# Patient Record
Sex: Male | Born: 1985 | Race: White | Hispanic: No | Marital: Single | Smoking: Current every day smoker
Health system: Southern US, Community
[De-identification: ages and names within clinical notes are randomized; demographics above are authoritative.]

## PROBLEM LIST (undated history)

## (undated) DIAGNOSIS — F419 Anxiety disorder, unspecified: Secondary | ICD-10-CM

## (undated) HISTORY — DX: Anxiety disorder, unspecified: F41.9

---

## 2009-08-30 ENCOUNTER — Ambulatory Visit: Payer: Self-pay | Admitting: Occupational Medicine

## 2009-09-14 ENCOUNTER — Ambulatory Visit: Payer: Self-pay | Admitting: Family Medicine

## 2010-08-02 ENCOUNTER — Ambulatory Visit
Admission: RE | Admit: 2010-08-02 | Discharge: 2010-08-02 | Payer: Self-pay | Source: Home / Self Care | Admitting: Emergency Medicine

## 2010-08-02 ENCOUNTER — Encounter: Payer: Self-pay | Admitting: Emergency Medicine

## 2010-08-08 ENCOUNTER — Telehealth (INDEPENDENT_AMBULATORY_CARE_PROVIDER_SITE_OTHER): Payer: Self-pay | Admitting: *Deleted

## 2010-08-23 NOTE — Letter (Signed)
Summary: Out of Work  [facility]  [address]   [place], [place] [address]   Phone: [phone]  Fax: [phone]    September 14, 2009   Employee:  Edward Anderson    To Whom It May Concern:   For Medical reasons, please excuse the above named employee from work today and tomorrow.    If you need additional information, please feel free to contact our office.         Sincerely,    Donna Christen MD

## 2010-08-23 NOTE — Assessment & Plan Note (Signed)
Summary: SORE THROAT & EARACHE/KH   Vital Signs:  Patient Profile:   25 Years Old Male CC:      Sore throat, exhaustion x 2 days Height:     66 inches Weight:      154 pounds O2 Sat:      98 % O2 treatment:    Room Air Temp:     98.1 degrees F oral Pulse rate:   69 / minute Pulse rhythm:   regular Resp:     12 per minute BP sitting:   127 / 75  (right arm) Cuff size:   regular  Vitals Entered By: Avel Sensor, CMA                  Current Allergies (reviewed today): No known allergies History of Present Illness Chief Complaint: Sore throat, exhaustion x 2 days History of Present Illness: Subjective: Patient complains of sore throat for 2 days.  He states that his previous flu-like illness resolved.  Sore throat is worse at night + mild cough No pleuritic pain No wheezing + mild nasal congestion No post-nasal drainage No sinus pain/pressure No itchy/red eyes No earache No hemoptysis No SOB + fever/chills No nausea No vomiting No abdominal pain No diarrhea No skin rashes + fatigue + myalgias No headache    Current Meds DAYQUIL MULTI-SYMPTOM 30-325-10 MG/15ML LIQD (PSEUDOEPHEDRINE-APAP-DM) as needed since last night IBUPROFEN 600 MG TABS (IBUPROFEN) prn NYQUIL 60-7.12-20-998 MG/30ML LIQD (PSEUDOEPH-DOXYLAMINE-DM-APAP)  AMOXICILLIN 500 MG CAPS (AMOXICILLIN) One capsule by mouth three times a day LORTAB 5 5-500 MG TABS (HYDROCODONE-ACETAMINOPHEN) One or two tabs by mouth hs as needed pain BENZONATATE 200 MG CAPS (BENZONATATE) One by mouth hs as needed cough  REVIEW OF SYSTEMS Constitutional Symptoms       Complains of fever and fatigue.     Denies chills, night sweats, weight loss, and weight gain.      Comments: At night Eyes       Complains of eye pain.      Denies change in vision, eye discharge, glasses, contact lenses, and eye surgery. Ear/Nose/Throat/Mouth       Complains of ear pain and sore throat.      Denies hearing loss/aids, change in hearing,  ear discharge, dizziness, frequent runny nose, frequent nose bleeds, sinus problems, hoarseness, and tooth pain or bleeding.  Respiratory       Denies dry cough, productive cough, wheezing, shortness of breath, asthma, bronchitis, and emphysema/COPD.  Cardiovascular       Denies murmurs, chest pain, and tires easily with exhertion.    Gastrointestinal       Denies stomach pain, nausea/vomiting, diarrhea, constipation, blood in bowel movements, and indigestion. Genitourniary       Denies painful urination, kidney stones, and loss of urinary control. Neurological       Denies paralysis, seizures, and fainting/blackouts. Musculoskeletal       Denies muscle pain, joint pain, joint stiffness, decreased range of motion, redness, swelling, muscle weakness, and gout.  Skin       Denies bruising, unusual mles/lumps or sores, and hair/skin or nail changes.  Psych       Denies mood changes, temper/anger issues, anxiety/stress, speech problems, depression, and sleep problems.  Past History:  Past Medical History: Reviewed history from 08/30/2009 and no changes required. Unremarkable  Past Surgical History: Reviewed history from 08/30/2009 and no changes required. Denies surgical history  Family History: Reviewed history from 08/30/2009 and no changes required. Brother-schizophrenia DM-grandmother  Social  History: Reviewed history from 08/30/2009 and no changes required. Occupation:landscaping Married Current Smoker- 1ppd Alcohol use-no Drug use-no Regular exercise-yes   Objective:  No acute distress  Eyes:  Pupils are equal, round, and reactive to light and accomdation.  Extraocular movement is intact.  Conjunctivae are not inflamed.  Ears:  Canals normal.  Tympanic membranes normal.   Nose:  Normal septum.  Normal turbinates, mildly congested.  No sinus tenderness present.  Pharynx:  Mildly erythematous Neck:  Supple.  No adenopathy is present.  Lungs:  Clear to auscultation.   Breath sounds are equal.  Heart:  Regular rate and rhythm without murmurs, rubs, or gallops.  Abdomen:  Nontender without masses or hepatosplenomegaly.  Bowel sounds are present.  No CVA or flank tenderness.  Rapid strep test negative  Assessment New Problems: ACUTE PHARYNGITIS (ICD-462)  RECURRENT VIRAL URI  Plan New Medications/Changes: BENZONATATE 200 MG CAPS (BENZONATATE) One by mouth hs as needed cough  #12 x 0, 09/14/2009, Donna Christen MD LORTAB 5 5-500 MG TABS (HYDROCODONE-ACETAMINOPHEN) One or two tabs by mouth hs as needed pain  #10 (ten) x 0, 09/14/2009, Donna Christen MD AMOXICILLIN 500 MG CAPS (AMOXICILLIN) One capsule by mouth three times a day  #21 x 0, 09/14/2009, Donna Christen MD  New Orders: Est. Patient Level III [16109] Rapid Strep [60454] Planning Comments:   With a second viral URI, patient more likely to experience a secondary bacterial infection.  Will begin Amoxicillin.  Begin expectorant/ decongestant.  Analgesic for sore throat at night.  Cough suppressant if necessary at night. Follow-up with PCP if not improving.   The patient and/or caregiver has been counseled thoroughly with regard to medications prescribed including dosage, schedule, interactions, rationale for use, and possible side effects and they verbalize understanding.  Diagnoses and expected course of recovery discussed and will return if not improved as expected or if the condition worsens. Patient and/or caregiver verbalized understanding.  Prescriptions: BENZONATATE 200 MG CAPS (BENZONATATE) One by mouth hs as needed cough  #12 x 0   Entered and Authorized by:   Donna Christen MD   Signed by:   Donna Christen MD on 09/14/2009   Method used:   Print then Give to Patient   RxID:   [id number] LORTAB 5 5-500 MG TABS (HYDROCODONE-ACETAMINOPHEN) One or two tabs by mouth hs as needed pain  #10 (ten) x 0   Entered and Authorized by:   Donna Christen MD   Signed by:   Donna Christen MD on  09/14/2009   Method used:   Print then Give to Patient   RxID:   [id number] AMOXICILLIN 500 MG CAPS (AMOXICILLIN) One capsule by mouth three times a day  #21 x 0   Entered and Authorized by:   Donna Christen MD   Signed by:   Donna Christen MD on 09/14/2009   Method used:   Print then Give to Patient   RxID:   [id number]   Patient Instructions: 1)  May use Mucinex D (guaifenesin with decongestant) twice daily for congestion. 2)  Increase fluid intake, rest. 3)  May use Afrin nasal spray (or generic oxymetazoline) twice daily for about 5 days.  Also recommend using saline nasal spray several times daily and/or saline nasal irrigation. 4)  Followup with family doctor if not improving one week.   Appended Document: SORE THROAT & EARACHE/KH Rapid Strep: Negative- [place]

## 2010-08-23 NOTE — Letter (Signed)
Summary: Handout Printed  Printed Handout:  - Rheumatic Fever 

## 2010-08-23 NOTE — Letter (Signed)
Summary: Work Paediatric nurse [facility]  [address]   [place], [place] [address]   Phone: [phone]  Fax: [phone]    Today's Date: August 30, 2009  Name of Patient: Edward Anderson  The above named patient had a medical visit today at:  am / pm.  Please take this into consideration when reviewing the time away from work/school.    Special Instructions:  [  ] None  Arly.[email or web address]  ] To be off the remainder of today, returning to the normal work / school schedule Thursday Feb 10  [  ] To be off until the next scheduled appointment on ______________________.  [  ] Other ________________________________________________________________ ________________________________________________________________________   Sincerely yours,   Lucia Gaskins MD

## 2010-08-23 NOTE — Assessment & Plan Note (Signed)
Summary: POSS FLU/TJ   Vital Signs:  Patient Profile:   25 Years Old Male CC:      flu-like symptoms and back pain Height:     66 inches Weight:      154 pounds O2 Sat:      97 % O2 treatment:    Room Air Temp:     101.7 degrees F oral Pulse rate:   95 / minute Pulse rhythm:   regular Resp:     18 per minute BP sitting:   142 / 74  (right arm)  Pt. in pain?   yes    Location:   lower back    Intensity:   6    Type:       dull  Vitals Entered By: Lita Mains, RN                   Updated Prior Medication List: DAYQUIL MULTI-SYMPTOM 30-325-10 MG/15ML LIQD (PSEUDOEPHEDRINE-APAP-DM) as needed since last night IBUPROFEN 600 MG TABS (IBUPROFEN) prn  Current Allergies: No known allergies History of Present Illness History from: patient Chief Complaint: flu-like symptoms and back pain History of Present Illness: Presents with complaints of fever, headache, body aches and chills, dry cough.   Symptoms present for 2 days.  Coughing keep him up at night.   Did not get a flu shot earlier in the season.   Last had ibuprofen early this am at 7:00am.  Temp 101 now.     REVIEW OF SYSTEMS Constitutional Symptoms       Complains of fever and fatigue.  Eyes       Complains of change in vision.      Comments: X 1 day Respiratory       Complains of dry cough and shortness of breath.     Neurological       Complains of headaches and weakness. Musculoskeletal       Complains of muscle pain.      Comments: body aches   Past History:  Past Medical History: Unremarkable  Past Surgical History: Denies surgical history  Family History: Brother-schizophrenia DM-grandmother  Social History: Occupation:landscaping Married Current Smoker- 1ppd Alcohol use-no Drug use-no Regular exercise-yes Smoking Status:  current Drug Use:  no Does Patient Exercise:  yes Physical Exam General appearance: well developed, well nourished, no acute distress Ears: normal, no lesions or  deformities Nasal: mucosa pink, nonedematous, no septal deviation, turbinates normal Oral/Pharynx: tongue normal, posterior pharynx without erythema or exudate Neck: supple,anterior lymphadenopathy present Chest/Lungs: no rales, wheezes, or rhonchi bilateral, breath sounds equal without effort Heart: regular rate and  rhythm, no murmur Assessment New Problems: INFLUENZA (ICD-487.8)   Plan New Medications/Changes: CHERATUSSIN AC 100-10 MG/5ML SYRP (GUAIFENESIN-CODEINE) 5ml every 8 hours as needed for cough  #4 oz x 0, 08/30/2009, Kathrine Haddock MD  New Orders: New Patient Level II [phone] Planning Comments:   Ibuprofen 800 TId for fever and pain Lots of rest and fluids Cheratussin for cough as needed Off work for the next 2 days.   The patient and/or caregiver has been counseled thoroughly with regard to medications prescribed including dosage, schedule, interactions, rationale for use, and possible side effects and they verbalize understanding.  Diagnoses and expected course of recovery discussed and will return if not improved as expected or if the condition worsens. Patient and/or caregiver verbalized understanding.  Prescriptions: CHERATUSSIN AC 100-10 MG/5ML SYRP (GUAIFENESIN-CODEINE) 5ml every 8 hours as needed for cough  #4 oz x 0  Entered and Authorized by:   Kathrine Haddock MD   Signed by:   Kathrine Haddock MD on 08/30/2009   Method used:   Print then Give to Patient   RxID:   [id number]

## 2010-08-25 NOTE — Progress Notes (Signed)
  Phone Note Outgoing Call Call back at [facility] Phone [phone]   Call placed by: Lajean Saver RN,  August 08, 2010 4:34 PM Call placed to: Patient Action Taken: Phone Call Completed Summary of Call: CAllback: Patient reports he still has some pain with walking but it is improved. I advised him to stay off of it as much as possible adn elevate when off his feet. I reminded him to schedule with Dr. Pearletha Forge if the problem persists.

## 2010-08-25 NOTE — Letter (Signed)
Summary: CONTROLLED RX POLICY   CONTROLLED RX POLICY   Imported By: Dannette Barbara 08/02/2010 11:06:37  _____________________________________________________________________  External Attachment:    Type:   Image     Comment:   External Document

## 2010-08-25 NOTE — Assessment & Plan Note (Signed)
Summary: RIGHT ANKLE SPRAIN/NH room 4   Vital Signs:  Patient Profile:   25 Years Old Male CC:      RT ankle pain Height:     66 inches O2 Sat:      97 % O2 treatment:    Room Air Temp:     98.6 degrees F oral Pulse rate:   78 / minute Resp:     16 per minute BP sitting:   132 / 75  (left arm) Cuff size:   regular  Pt. in pain?   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  This was 20 mins ago.  He felt and heard a pop and was unable to bear weight on it afterwards.  Immediate pain, no swelling or bruising yet.  Pain is located on the top of his foot.  It hurts to move it, feels better to keep toes pointed.  Using crutches which also help.  REVIEW OF SYSTEMS Constitutional Symptoms      Denies fever, chills, night sweats, weight loss, weight gain, and fatigue.  Eyes       Denies change in vision, eye pain, eye discharge, glasses, contact lenses, and eye surgery. Ear/Nose/Throat/Mouth       Denies hearing loss/aids, change in hearing, ear pain, ear discharge, dizziness, frequent runny nose, frequent nose bleeds, sinus problems, sore throat, hoarseness, and tooth pain or bleeding.  Respiratory       Denies dry cough, productive cough, wheezing, shortness of breath, asthma, bronchitis, and emphysema/COPD.  Cardiovascular       Denies murmurs, chest pain, and tires easily with exhertion.    Gastrointestinal       Denies stomach pain, nausea/vomiting, diarrhea, constipation, blood in bowel movements, and indigestion. Genitourniary       Denies painful urination, kidney stones, and loss of urinary  control. Neurological       Denies paralysis, seizures, and fainting/blackouts. Musculoskeletal       Complains of joint pain and decreased range of motion.      Denies muscle pain, joint stiffness, redness, swelling, muscle weakness, and gout.  Skin       Denies bruising, unusual mles/lumps or sores, and hair/skin or nail changes.  Psych       Denies mood changes, temper/anger issues, anxiety/stress, speech problems, depression, and sleep problems. Other Comments: pt c/o RT ankle pain x 20 minutes ago he fell in a hole and twisted his ankle. he states he hear and felt a pop in his ankle. he states he felt light headed immediately after.    Past History:  Past Medical History: Reviewed history from 08/30/2009 and no changes required. Unremarkable  Past Surgical History: Reviewed history from 08/30/2009 and no changes required. Denies surgical history  Family History: Reviewed history from 08/30/2009 and no changes required. Brother-schizophrenia DM-grandmother  Social History: Reviewed history from 08/30/2009 and no changes required. Occupation:landscaping Married Current Smoker- 1ppd Alcohol use-no Drug use-no Regular exercise-yes Physical Exam  General appearance: well developed, well nourished,mild distress and using crutches Skin: no obvious rashes or lesions MSE: oriented to time, place, and person R foot/ ankle: FROM, but all resisted motions are painful.  No TTP medial/lateral malleolus, navicular, base of 5th, calcaneus, Achilles, or proximal fibula.  No swelling.  No ecchymoses.  He is TTP dorsal prox MT 2-5, cuneiforms, as well as over Lisfranc joint.  Distal NV status intact. Assessment New Problems: FOOT PAIN, RIGHT (ICD-729.5)   Patient Education: Patient and/or caregiver instructed in the following: rest, fluids.  Plan New Medications/Changes: VICODIN 5-500 MG TABS (HYDROCODONE-ACETAMINOPHEN) 1 by mouth at bedtime x5 days as needed severe pain  #5 x 0,  08/02/2010, Hoyt Koch MD TRAMADOL HCL 50 MG TABS (TRAMADOL HCL) 1 by mouth q6 hrs as needed pain  #20 x 0, 08/02/2010, Hoyt Koch MD  New Orders: Est. Patient Level II [81191] T-DG Foot Complete*R* [47829] Planning Comments:   Xray read by radiology as normal.  So this is likely a mid-foot sprain. Continue using the crutches for a few days.  Ice, elevate, ACE bandage to decrease swelling.  Follow up with Dr. Pearletha Forge in 1 week if still painful. Rx for Tramadol for pain.  Then #5 Vicodin only at night to sleep.   The patient and/or caregiver has been counseled thoroughly with regard to medications prescribed including dosage, schedule, interactions, rationale for use, and possible side effects and they verbalize understanding.  Diagnoses and expected course of recovery discussed and will return if not improved as expected or if the condition worsens. Patient and/or caregiver verbalized understanding.  Prescriptions: VICODIN 5-500 MG TABS (HYDROCODONE-ACETAMINOPHEN) 1 by mouth at bedtime x5 days as needed severe pain  #5 x 0   Entered and Authorized by:   Hoyt Koch MD   Signed by:   Hoyt Koch MD on 08/02/2010   Method used:   Print then Give to Patient   RxID:   [id number] TRAMADOL HCL 50 MG TABS (TRAMADOL HCL) 1 by mouth q6 hrs as needed pain  #20 x 0   Entered and Authorized by:   Hoyt Koch MD   Signed by:   Hoyt Koch MD on 08/02/2010   Method used:   Print then Give to Patient   RxID:   [id number]   Orders Added: 1)  Est. Patient Level II [53664] 2)  T-DG Foot Complete*R* [40347]

## 2010-12-12 ENCOUNTER — Inpatient Hospital Stay (INDEPENDENT_AMBULATORY_CARE_PROVIDER_SITE_OTHER)
Admission: RE | Admit: 2010-12-12 | Discharge: 2010-12-12 | Disposition: A | Discharge status: Home / Self Care | Payer: Self-pay | Source: Ambulatory Visit | Attending: Emergency Medicine | Admitting: Emergency Medicine

## 2010-12-12 ENCOUNTER — Encounter: Payer: Self-pay | Admitting: Emergency Medicine

## 2010-12-12 ENCOUNTER — Encounter (INDEPENDENT_AMBULATORY_CARE_PROVIDER_SITE_OTHER): Payer: Self-pay | Admitting: *Deleted

## 2010-12-12 DIAGNOSIS — J069 Acute upper respiratory infection, unspecified: Secondary | ICD-10-CM

## 2011-06-26 NOTE — Letter (Signed)
Summary: Out of Work  [facility]  [address]   [place], [place] [address]   Phone: [phone]  Fax: [phone]    Dec 12, 2010   Employee:  Edward Anderson    To Whom It May Concern:   For Medical reasons, please excuse the above named employee from work for the following dates:  12/12/2010  If you need additional information, please feel free to contact our office.         Sincerely,    Lajean Saver RN

## 2011-06-26 NOTE — Progress Notes (Signed)
Summary: ALLERGIES   Vital Signs:  Patient Profile:   25 Years Old Male CC:      cough, weakness, fever, congestion x 4 days Height:     66 inches Weight:      153 pounds O2 Sat:      98 % O2 treatment:    Room Air Temp:     98.6 degrees F oral Pulse rate:   65 / minute Resp:     16 per minute BP sitting:   133 / 80  (left arm) Cuff size:   regular  Vitals Entered By: Lajean Saver RN (Dec 12, 2010 12:02 PM)                  Updated Prior Medication List: No Medications Current Allergies: No known allergies History of Present Illness History from: patient Chief Complaint: cough, weakness, fever, congestion x 4 days History of Present Illness: 25 Years Old Male complains of onset of cold symptoms for 4-5 days.  Edward Anderson has been using OTC cough/cold meds which is helping a little bit. + sore throat + cough No pleuritic pain No wheezing + nasal congestion + post-nasal drainage + sinus pain/pressure No chest congestion No itchy/red eyes No earache No hemoptysis No SOB + chills/sweats No fever No nausea No vomiting No abdominal pain No diarrhea No skin rashes + fatigue + myalgias No headache   REVIEW OF SYSTEMS Constitutional Symptoms       Complains of fever, chills, and fatigue.     Denies night sweats, weight loss, and weight gain.  Eyes       Denies change in vision, eye pain, eye discharge, glasses, contact lenses, and eye surgery. Ear/Nose/Throat/Mouth       Complains of sinus problems and sore throat.      Denies hearing loss/aids, change in hearing, ear pain, ear discharge, dizziness, frequent runny nose, frequent nose bleeds, hoarseness, and tooth pain or bleeding.      Comments: sore throat resolved Respiratory       Complains of productive cough.      Denies dry cough, wheezing, shortness of breath, asthma, bronchitis, and emphysema/COPD.  Cardiovascular       Denies murmurs, chest pain, and tires easily with exhertion.    Gastrointestinal  Denies stomach pain, nausea/vomiting, diarrhea, constipation, blood in bowel movements, and indigestion. Genitourniary       Denies painful urination, blood or discharge from penis, kidney stones, and loss of urinary control. Neurological       Denies paralysis, seizures, and fainting/blackouts. Musculoskeletal       Denies muscle pain, joint pain, joint stiffness, decreased range of motion, redness, swelling, muscle weakness, and gout.  Skin       Denies bruising, unusual mles/lumps or sores, and hair/skin or nail changes.  Psych       Denies mood changes, temper/anger issues, anxiety/stress, speech problems, depression, and sleep problems. Other Comments: Taken Benadrly allergy, tylenol cold/flu and Mucinex OTC. Symptoms x 4 days   Past History:  Past Medical History: Reviewed history from 08/30/2009 and no changes required. Unremarkable  Past Surgical History: Reviewed history from 08/30/2009 and no changes required. Denies surgical history  Family History: Reviewed history from 08/30/2009 and no changes required. Brother-schizophrenia DM-grandmother  Social History: Occupation:landscaping Married Current Smoker- 2 ppd x 2 years Alcohol use-no Drug use-no Regular exercise-yes Physical Exam General appearance: well developed, well nourished, no acute distress Ears: normal, no lesions or deformities Nasal: clear discharge Oral/Pharynx: tongue normal,  posterior pharynx without erythema or exudate Chest/Lungs: no rales, wheezes, or rhonchi bilateral, breath sounds equal without effort Heart: regular rate and  rhythm, no murmur MSE: oriented to time, place, and person Assessment New Problems: UPPER RESPIRATORY INFECTION, ACUTE (ICD-465.9)   Plan New Medications/Changes: CHERATUSSIN AC 100-10 MG/5ML SYRP (GUAIFENESIN-CODEINE) 5cc q6 hrs as needed for cough  #6oz x 0, 12/12/2010, Hoyt Koch MD  New Orders: Est. Patient Level III [16109] Pulse Oximetry (single  measurment) [94760] Planning Comments:   1)  No antibiotic since likely viral. 2)  Use nasal saline solution (over the counter) at least 3 times a day. 3)  Use over the counter decongestants like Zyrtec-D every 12 hours as needed to help with congestion. 4)  Can take tylenol every 6 hours or motrin every 8 hours for pain or fever. 5)  Follow up with your primary doctor  if no improvement in 5-7 days, sooner if increasing pain, fever, or new symptoms.    The patient and/or caregiver has been counseled thoroughly with regard to medications prescribed including dosage, schedule, interactions, rationale for use, and possible side effects and they verbalize understanding.  Diagnoses and expected course of recovery discussed and will return if not improved as expected or if the condition worsens. Patient and/or caregiver verbalized understanding.  Prescriptions: CHERATUSSIN AC 100-10 MG/5ML SYRP (GUAIFENESIN-CODEINE) 5cc q6 hrs as needed for cough  #6oz x 0   Entered and Authorized by:   Hoyt Koch MD   Signed by:   Hoyt Koch MD on 12/12/2010   Method used:   Print then Give to Patient   RxID:   [id number]   Orders Added: 1)  Est. Patient Level III [95621] 2)  Pulse Oximetry (single measurment) [30865]

## 2014-08-10 ENCOUNTER — Encounter: Payer: Self-pay | Admitting: Family Medicine

## 2014-08-10 ENCOUNTER — Ambulatory Visit (INDEPENDENT_AMBULATORY_CARE_PROVIDER_SITE_OTHER): Payer: Self-pay | Admitting: Family Medicine

## 2014-08-10 VITALS — BP 131/76 | HR 72 | Wt 161.0 lb

## 2014-08-10 DIAGNOSIS — F17200 Nicotine dependence, unspecified, uncomplicated: Secondary | ICD-10-CM

## 2014-08-10 DIAGNOSIS — Z720 Tobacco use: Secondary | ICD-10-CM

## 2014-08-10 DIAGNOSIS — B356 Tinea cruris: Secondary | ICD-10-CM

## 2014-08-10 DIAGNOSIS — N529 Male erectile dysfunction, unspecified: Secondary | ICD-10-CM | POA: Insufficient documentation

## 2014-08-10 DIAGNOSIS — N528 Other male erectile dysfunction: Secondary | ICD-10-CM

## 2014-08-10 DIAGNOSIS — F419 Anxiety disorder, unspecified: Secondary | ICD-10-CM

## 2014-08-10 MED ORDER — VARENICLINE TARTRATE 0.5 MG X 11 & 1 MG X 42 PO MISC: ORAL | Status: DC

## 2014-08-10 MED ORDER — VENLAFAXINE HCL ER 150 MG PO CP24: 150.0000 mg | ORAL_CAPSULE | Freq: Every day | ORAL | Status: DC

## 2014-08-10 MED ORDER — VARENICLINE TARTRATE 1 MG PO TABS: 1.0000 mg | ORAL_TABLET | Freq: Two times a day (BID) | ORAL | Status: DC

## 2014-08-10 MED ORDER — KETOCONAZOLE 2 % EX SHAM: MEDICATED_SHAMPOO | CUTANEOUS | Status: DC

## 2014-08-10 MED ORDER — SILDENAFIL CITRATE 20 MG PO TABS: 20.0000 mg | ORAL_TABLET | Freq: Every day | ORAL | Status: DC | PRN

## 2014-08-10 NOTE — Progress Notes (Signed)
CC: Edward Anderson is a 29 y.o. male is here for Establish Care   Subjective: HPI:  Pleasant 29 year old here to establish care  Reports a history of anxiety that's been present for at least a year on a daily basis now. It is worse in crowded situations such as when he is at [facility] or crowded restaurants. It is described as a rapid heartbeat, constant sense of worry, and just subjective anxiety. Symptoms are moderate in severity when in situations above. Other than above nothing seems to make better or worse. He's tried Paxil in the past however did not have any benefit. Rare alcohol use no recreational drug use. He denies paranoia, hallucinations, nor any other mental disturbance other than that described above.  Complains of difficulty initiating and maintaining an erection that has been present for at least a year now. He denies any nocturnal erections. He states that this issue occurs 50% of the time when he tries to initiate an erection. He's been taking an over-the-counter medication for this but is not seeming to help much. He still is physically attracted to his wife and denies any recent or remote genitourinary complaints other than that above.  Complains of recurrent jock itch. Currently he is not having any symptoms but he tells me a few times out of the year he will get itching and redness in the groin bilaterally that resolves with ketoconazole. He is wanting a refill on this as appropriate.  He tells me he's been smoking on a daily basis for at least 3 years now. He has never tried quitting due to fears that he would not be able to. He wants to know Chantix is something that could help him.  Review of Systems - General ROS: negative for - chills, fever, night sweats, weight gain or weight loss Ophthalmic ROS: negative for - decreased vision Psychological ROS: negative for - depression ENT ROS: negative for - hearing change, nasal congestion, tinnitus or allergies Hematological and  Lymphatic ROS: negative for - bleeding problems, bruising or swollen lymph nodes Breast ROS: negative Respiratory ROS: no cough, shortness of breath, or wheezing Cardiovascular ROS: no chest pain or dyspnea on exertion Gastrointestinal ROS: no abdominal pain, change in bowel habits, or black or bloody stools Genito-Urinary ROS: negative for - genital discharge, genital ulcers, incontinence or abnormal bleeding from genitals Musculoskeletal ROS: negative for - joint pain or muscle pain Neurological ROS: negative for - headaches or memory loss Dermatological ROS: negative for lumps, mole changes, rash and skin lesion changes  Past Medical History  Diagnosis Date  . Anxiety     History reviewed. No pertinent past surgical history. Family History  Problem Relation Age of Onset  . Depression Brother   . Birth defects Paternal Uncle   . Diabetes Maternal Grandmother   . Hypertension Maternal Grandmother   . Birth defects Paternal Grandfather   . Schizophrenia Brother     History   Social History  . Marital Status: Single    Spouse Name: N/A    Number of Children: N/A  . Years of Education: N/A   Occupational History  . Not on file.   Social History Main Topics  . Smoking status: Current Every Day Smoker -- 1.50 packs/day    Types: Cigarettes  . Smokeless tobacco: Not on file  . Alcohol Use: 0.0 oz/week    0 Not specified per week     Comment: occasional   . Drug Use: No  . Sexual Activity:  Partners: Female   Other Topics Concern  . Not on file   Social History Narrative     Objective: BP 131/76 mmHg  Pulse 72  Wt 161 lb (73.029 kg)  General: Alert and Oriented, No Acute Distress HEENT: Pupils equal, round, reactive to light. Conjunctivae clear.  External ears unremarkable, canals clear with intact TMs with appropriate landmarks.  Middle ear appears open without effusion. Pink inferior turbinates.  Moist mucous membranes, pharynx without inflammation nor lesions.   Neck supple without palpable lymphadenopathy nor abnormal masses. Lungs: Clear to auscultation bilaterally, no wheezing/ronchi/rales.  Comfortable work of breathing. Good air movement. Cardiac: Regular rate and rhythm. Normal S1/S2.  No murmurs, rubs, nor gallops.   Extremities: No peripheral edema.  Strong peripheral pulses.  Mental Status: No depression, anxiety, nor agitation. Skin: Warm and dry.  Assessment & Plan: Tawanna Coolerodd was seen today for establish care.  Diagnoses and associated orders for this visit:  Anxiety - venlafaxine XR (EFFEXOR XR) 150 MG 24 hr capsule; Take 1 capsule (150 mg total) by mouth daily with breakfast.  Other male erectile dysfunction - sildenafil (REVATIO) 20 MG tablet; Take 1-3 tablets (20-60 mg total) by mouth daily as needed (sex).  Tinea cruris - ketoconazole (NIZORAL) 2 % shampoo; Use daily only as needed for fungal infection.  Tobacco use disorder - varenicline (CHANTIX STARTING MONTH PAK) 0.5 MG X 11 & 1 MG X 42 tablet; Take one 0.5 mg tablet by mouth once daily for 3 days, then increase to one 0.5 mg tablet twice daily for 4 days, then increase to one 1 mg tablet twice daily. - varenicline (CHANTIX) 1 MG tablet; Take 1 tablet (1 mg total) by mouth 2 (two) times daily.    Anxiety: Uncontrolled chronic condition start Effexor Erectile dysfunction: He would not be able to afford Viagra or Cialis therefore will use sildenafil. I suspect this issue is psychogenic and will improve after a few uses of generic Viagra. Discussed with him I'm hopeful that he does not need to take medication for sex in the future Tinea cruris: As needed use of ketoconazole provided Tobacco use: Begin Chantix, he is aware of possible vivid dreams  Return in about 4 weeks (around 09/07/2014) for Anxiety.

## 2014-08-12 ENCOUNTER — Telehealth: Payer: Self-pay

## 2014-08-12 ENCOUNTER — Other Ambulatory Visit: Payer: Self-pay | Admitting: *Deleted

## 2014-08-12 DIAGNOSIS — N528 Other male erectile dysfunction: Secondary | ICD-10-CM

## 2014-08-12 MED ORDER — SILDENAFIL CITRATE 20 MG PO TABS: 20.0000 mg | ORAL_TABLET | Freq: Every day | ORAL | Status: DC | PRN

## 2014-08-12 MED ORDER — SERTRALINE HCL 50 MG PO TABS: 50.0000 mg | ORAL_TABLET | Freq: Every day | ORAL | Status: DC

## 2014-08-12 NOTE — Telephone Encounter (Signed)
Edward Anderson, Sildenafil is a generic form of Viagra and it is the cheapest medication for erectile dysfunction on the market.  As a substitution for effexor I've printed off an Rx for sertraline which is a generic form of zoloft. (in your inbox ready for pickup/fax)

## 2014-08-12 NOTE — Telephone Encounter (Signed)
rx sent to [facility] [address] [facility] [place] per ArnoldJamie

## 2014-08-12 NOTE — Telephone Encounter (Signed)
The sildenafill and the Effexor cost too much. He would like the medications switched to something cheaper. Please advise.

## 2014-08-12 NOTE — Progress Notes (Signed)
Pt wanted this rx to go to [facility] [place] [address] to see if it would be cheaper

## 2014-09-07 ENCOUNTER — Ambulatory Visit: Payer: Self-pay | Admitting: Family Medicine

## 2014-09-07 ENCOUNTER — Telehealth: Payer: Self-pay | Admitting: Family Medicine

## 2014-09-07 NOTE — Telephone Encounter (Signed)
Patient states the medications he was on made him have sucidal thoughts so he stopped taking them.  He is going to call back to reschedule

## 2014-09-07 NOTE — Telephone Encounter (Signed)
Patient states he stopped the medication Chantix and Zoloft. He reports having extreme forgetfulness and crazy thoughts. He is feeling better having stopped medications. He will call later to schedule a follow up appointment.

## 2016-10-20 ENCOUNTER — Emergency Department (HOSPITAL_BASED_OUTPATIENT_CLINIC_OR_DEPARTMENT_OTHER): Payer: Self-pay

## 2016-10-20 ENCOUNTER — Emergency Department (HOSPITAL_BASED_OUTPATIENT_CLINIC_OR_DEPARTMENT_OTHER)
Admission: EM | Admit: 2016-10-20 | Discharge: 2016-10-20 | Disposition: A | Discharge status: Home / Self Care | Payer: Self-pay | Attending: Emergency Medicine | Admitting: Emergency Medicine

## 2016-10-20 ENCOUNTER — Encounter (HOSPITAL_BASED_OUTPATIENT_CLINIC_OR_DEPARTMENT_OTHER): Payer: Self-pay | Admitting: Emergency Medicine

## 2016-10-20 DIAGNOSIS — Y939 Activity, unspecified: Secondary | ICD-10-CM | POA: Insufficient documentation

## 2016-10-20 DIAGNOSIS — Y999 Unspecified external cause status: Secondary | ICD-10-CM | POA: Insufficient documentation

## 2016-10-20 DIAGNOSIS — Y929 Unspecified place or not applicable: Secondary | ICD-10-CM | POA: Insufficient documentation

## 2016-10-20 DIAGNOSIS — F17210 Nicotine dependence, cigarettes, uncomplicated: Secondary | ICD-10-CM | POA: Insufficient documentation

## 2016-10-20 DIAGNOSIS — S46911A Strain of unspecified muscle, fascia and tendon at shoulder and upper arm level, right arm, initial encounter: Secondary | ICD-10-CM | POA: Insufficient documentation

## 2016-10-20 DIAGNOSIS — X500XXA Overexertion from strenuous movement or load, initial encounter: Secondary | ICD-10-CM | POA: Insufficient documentation

## 2016-10-20 MED ORDER — NAPROXEN 500 MG PO TABS: 500.0000 mg | ORAL_TABLET | Freq: Two times a day (BID) | ORAL | 0 refills | Status: AC

## 2016-10-20 MED ORDER — HYDROCODONE-ACETAMINOPHEN 5-325 MG PO TABS: 1.0000 | ORAL_TABLET | Freq: Four times a day (QID) | ORAL | 0 refills | Status: AC | PRN

## 2016-10-20 MED ORDER — KETOROLAC TROMETHAMINE 60 MG/2ML IM SOLN
60.0000 mg | Freq: Once | INTRAMUSCULAR | Status: AC
  Administered 2016-10-20: 60 mg via INTRAMUSCULAR
  Filled 2016-10-20: qty 2

## 2016-10-20 MED FILL — NAPROXEN 500 MG TABLET: 500 | 5 days supply | Qty: 10 | Fill #0

## 2016-10-20 MED FILL — HYDROCODON-APAP 5-325: 5-325 | 2 days supply | Qty: 5 | Fill #0

## 2016-10-20 NOTE — ED Provider Notes (Signed)
[facility]   CSN: [id number] Arrival date & time: 10/20/16  0817     History   Chief Complaint Chief Complaint  Patient presents with  . Shoulder Pain    HPI Edward Anderson is a 31 y.o. male.  The history is provided by the patient. No language interpreter was used.  Shoulder Pain     Edward Anderson is a 31 y.o. male who presents to the Emergency Department complaining of right shoulder pain.  He is left-handed and was lifting a heavy object yesterday when he developed immediate pain in his right posterior shoulder with a popping sensation. He states this was when he was lifting an object up, above his head. He reports severe pain with range of motion of the right shoulder with mild pain at rest. No prior similar injuries or symptoms. No medical problems. He works as a Psychologist, occupational. No associated numbness, weakness, chest pain, shortness breath. Past Medical History:  Diagnosis Date  . Anxiety     Patient Active Problem List   Diagnosis Date Noted  . Anxiety 08/10/2014  . Erectile dysfunction 08/10/2014    History reviewed. No pertinent surgical history.     Home Medications    Prior to Admission medications   Not on File    Family History Family History  Problem Relation Age of Onset  . Depression Brother   . Birth defects Paternal Uncle   . Diabetes Maternal Grandmother   . Hypertension Maternal Grandmother   . Birth defects Paternal Grandfather   . Schizophrenia Brother     Social History Social History  Substance Use Topics  . Smoking status: Current Every Day Smoker    Packs/day: 1.50    Types: Cigarettes  . Smokeless tobacco: Never Used  . Alcohol use 0.0 oz/week     Comment: occasional      Allergies   Chantix [varenicline] and Zoloft [sertraline hcl]   Review of Systems Review of Systems  All other systems reviewed and are negative.    Physical Exam Updated Vital Signs BP (!) 156/94   Pulse 83   Temp 98.4 F  (36.9 C) (Oral)   Resp 18   Ht  (1.702 m)   Wt 165 lb (74.8 kg)   SpO2 100%   BMI 25.84 kg/m   Physical Exam  Constitutional: He is oriented to person, place, and time. He appears well-developed and well-nourished.  HENT:  Head: Normocephalic and atraumatic.  Cardiovascular: Normal rate and regular rhythm.   No murmur heard. Pulmonary/Chest: Effort normal and breath sounds normal. No respiratory distress.  Abdominal: Soft. There is no tenderness. There is no rebound and no guarding.  Musculoskeletal:  2+ radial pulses bilaterally. There is swelling with mild tenderness to the right anterior shoulder. Right posterior shoulder with moderate tenderness to palpation with no appreciable swelling. Range of motion is intact throughout the right shoulder but there is significant pain with range of motion. No elbow or wrist tenderness to palpation.  Neurological: He is alert and oriented to person, place, and time.  5 out of 5 grip strength in bilateral upper extremities. sensation to light touch intact throughout digits, upper arm.  Skin: Skin is warm and dry. Capillary refill takes less than 2 seconds.  Psychiatric: He has a normal mood and affect. His behavior is normal.  Nursing note and vitals reviewed.    ED Treatments / Results  Labs (all labs ordered are listed, but only abnormal results are displayed) Labs  Reviewed - No data to display  EKG  EKG Interpretation None       Radiology No results found.  Procedures Procedures (including critical care time)  Medications Ordered in ED Medications  ketorolac (TORADOL) injection 60 mg (not administered)     Initial Impression / Assessment and Plan / ED Course  I have reviewed the triage vital signs and the nursing notes.  Pertinent labs & imaging results that were available during my care of the patient were reviewed by me and considered in my medical decision making (see chart for details).     Patient here for  evaluation of right shoulder pain following a lifting injury yesterday. He does have swelling and tenderness on examination. He is neurovascularly intact with no evidence of acute infectious process. Plan to place In sling for comfort with early range of motion, NSAIDs, rest, outpatient follow-up and return precautions.  Final Clinical Impressions(s) / ED Diagnoses   Final diagnoses:  Strain of right shoulder, initial encounter    New Prescriptions New Prescriptions   No medications on file     Tilden Fossa, MD 10/20/16 [phone]

## 2016-10-20 NOTE — ED Notes (Signed)
Patient transported to X-ray 

## 2016-10-20 NOTE — ED Triage Notes (Signed)
Right shoulder pain

## 2017-10-14 IMAGING — CR DG SHOULDER 2+V*R*
3 series · 3 of 3 positions shown · non-contrast
Comparison: None.

CLINICAL DATA: Lifting injury yesterday. Painful with motion today.

EXAM:
RIGHT SHOULDER - 2+ VIEW

[w shoulder grashey right]
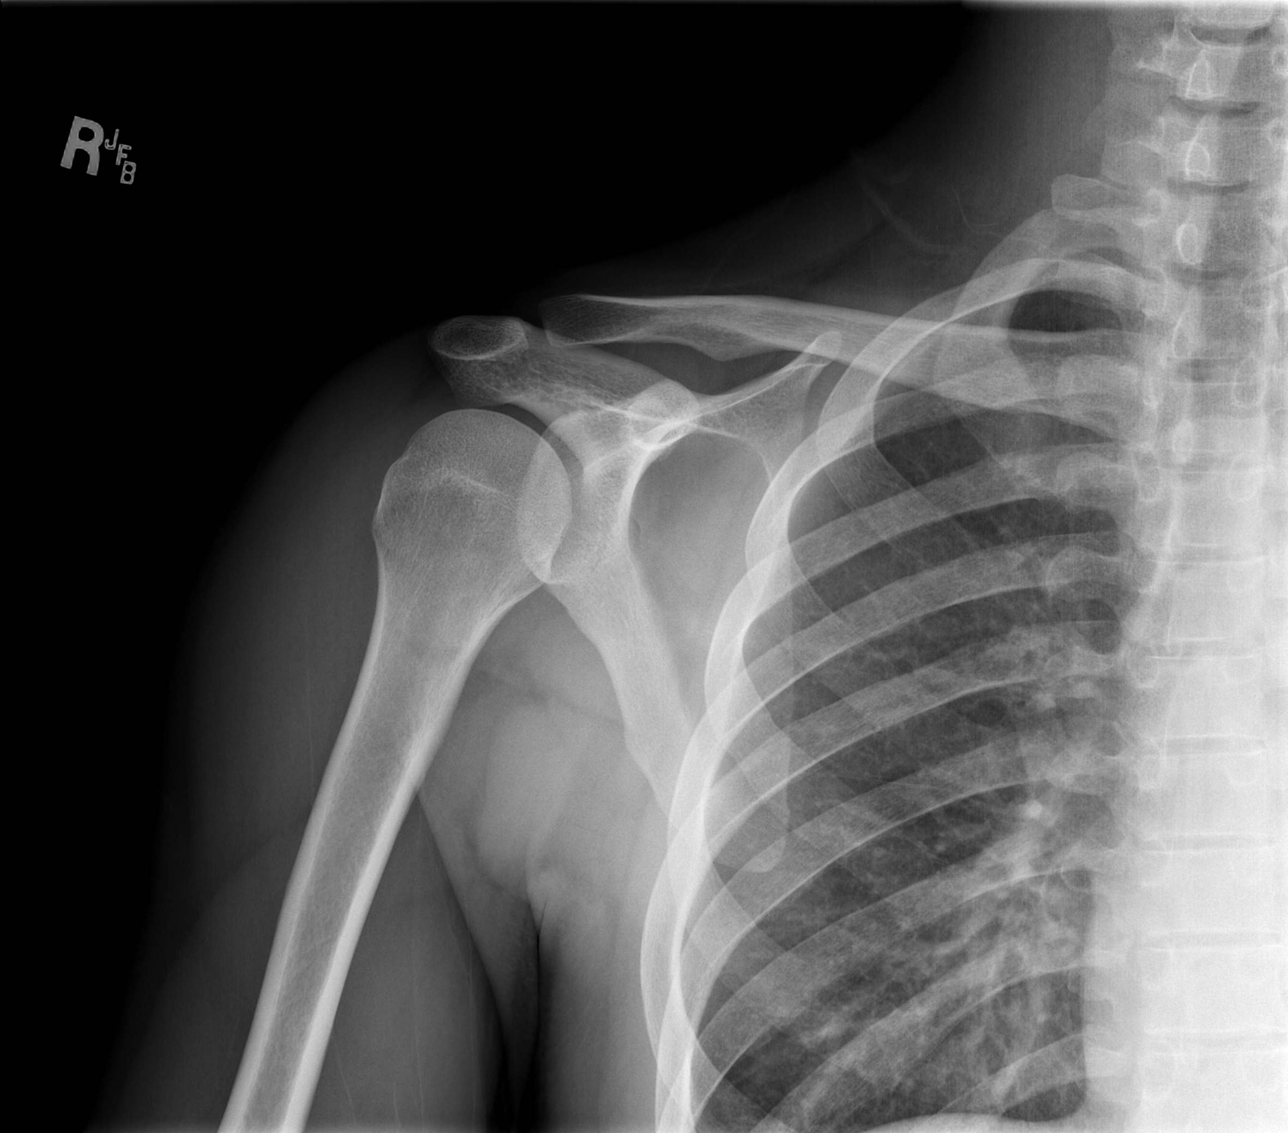

[w shoulder y view right]
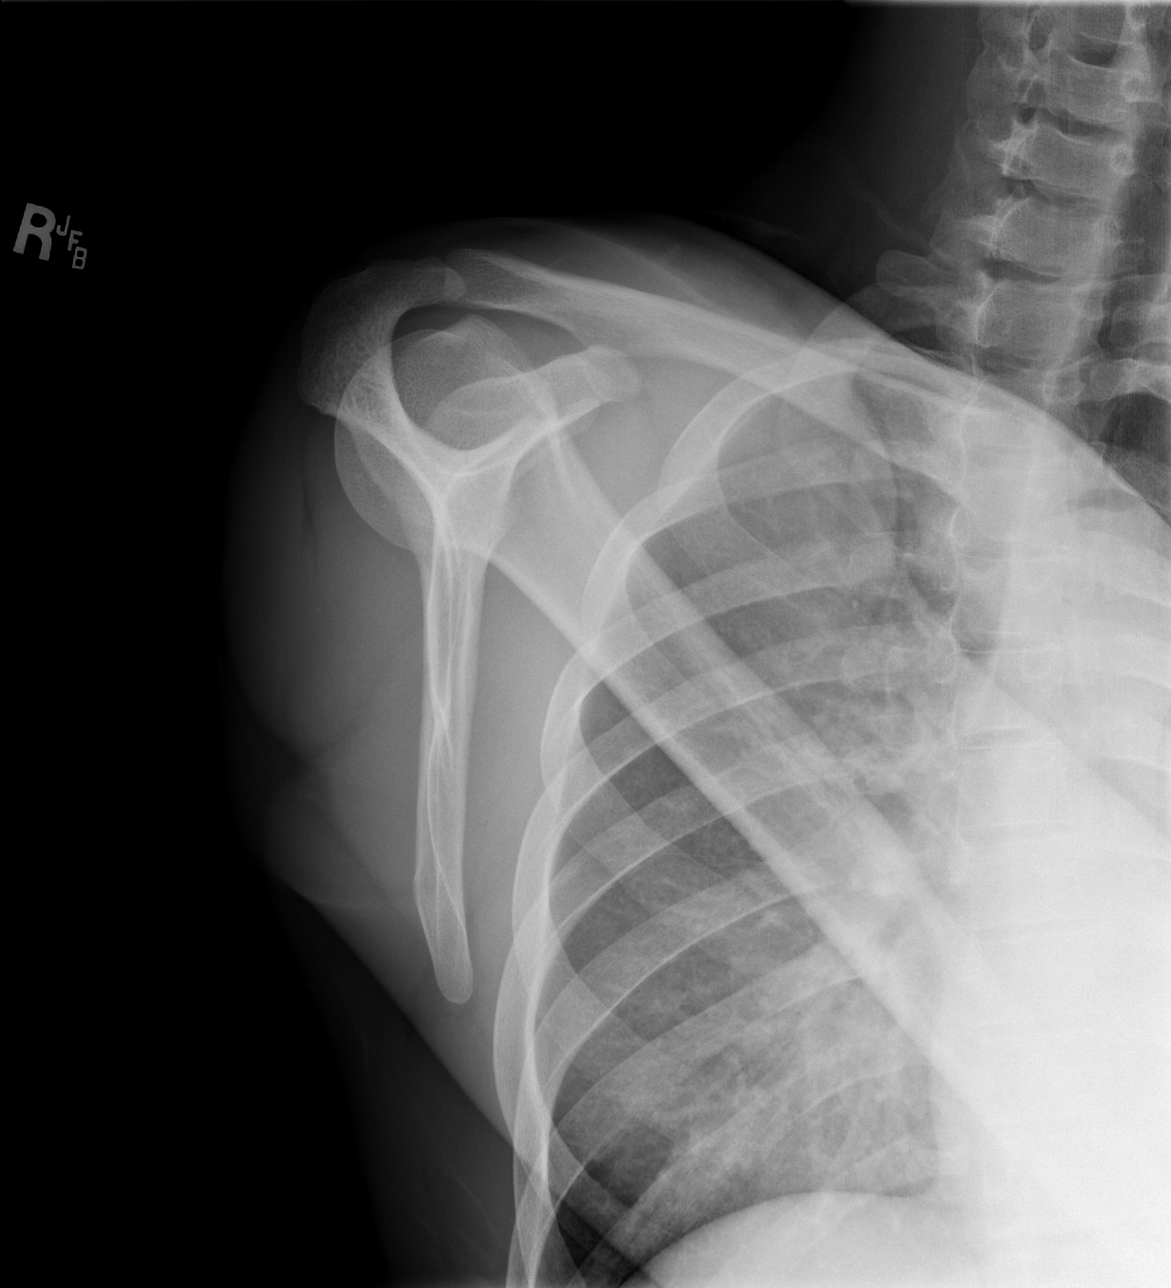

[x shoulder axillary right]
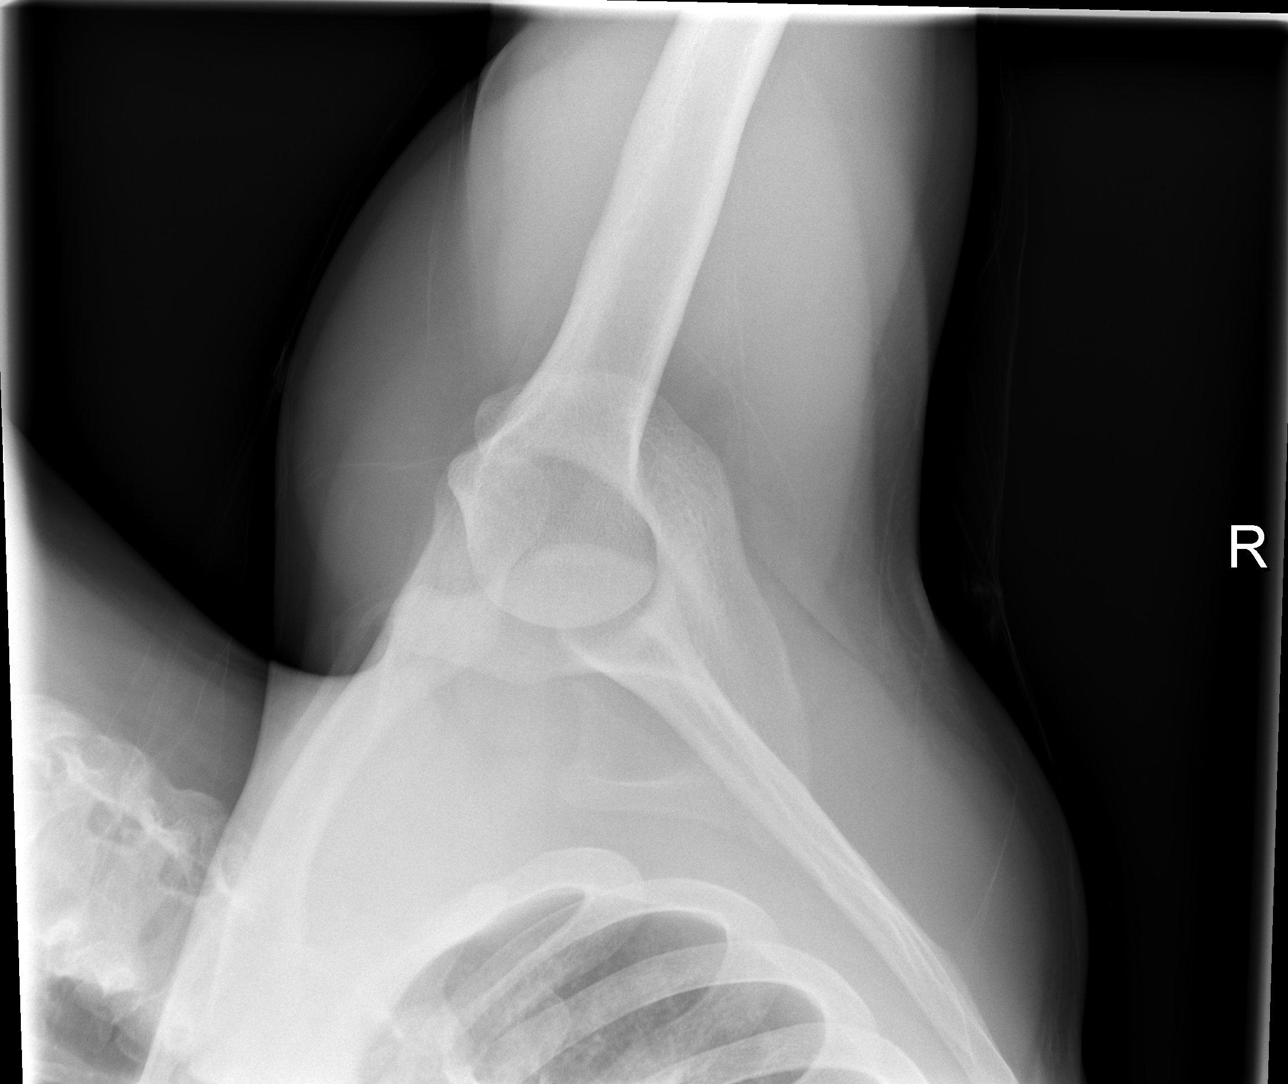

[3 of 3 positions shown; findings below may reference images not displayed]

FINDINGS: There is no evidence of fracture or dislocation. There is no
evidence of arthropathy or other focal bone abnormality. Soft
tissues are unremarkable.
IMPRESSION: Negative.

## 2023-11-12 ENCOUNTER — Ambulatory Visit (INDEPENDENT_AMBULATORY_CARE_PROVIDER_SITE_OTHER): Admitting: Licensed Clinical Social Worker

## 2023-11-12 DIAGNOSIS — F4323 Adjustment disorder with mixed anxiety and depressed mood: Secondary | ICD-10-CM | POA: Diagnosis not present

## 2023-11-12 DIAGNOSIS — F4310 Post-traumatic stress disorder, unspecified: Secondary | ICD-10-CM
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
## 2023-12-04 ENCOUNTER — Ambulatory Visit (HOSPITAL_COMMUNITY): Admitting: Licensed Clinical Social Worker

## 2023-12-26 ENCOUNTER — Ambulatory Visit (HOSPITAL_COMMUNITY): Admitting: Licensed Clinical Social Worker

## 2023-12-31 ENCOUNTER — Encounter (HOSPITAL_COMMUNITY): Payer: Self-pay

## 2023-12-31 ENCOUNTER — Ambulatory Visit (INDEPENDENT_AMBULATORY_CARE_PROVIDER_SITE_OTHER): Admitting: Licensed Clinical Social Worker

## 2023-12-31 DIAGNOSIS — F4310 Post-traumatic stress disorder, unspecified: Secondary | ICD-10-CM

## 2023-12-31 DIAGNOSIS — F4323 Adjustment disorder with mixed anxiety and depressed mood: Secondary | ICD-10-CM | POA: Diagnosis not present

## 2023-12-31 NOTE — Progress Notes (Unsigned)
 THERAPIST PROGRESS NOTE  Session Time:***  Type of Therapy: Individual Therapy  Purpose of session/Treatment Goals addressed: ***  Interventions: Therapist utilized CBT and  to address ***. Therapist provided support and empathy to patient during session.    Effectiveness: Patient was oriented x4 (person, place, situation, and time) . Patient was  {B[facility] AFFECT:22266}. Patient was {Appearance:22683}  Patient engaged in session. Patient responded well to interventions. Patient continues to meet criteria for *** Patient will continue in outpatient therapy due to being the least restrictive service to meet her needs. Patient made *** progress on *** goals at this time.     Suicidal/Homicidal:  {B[facility] YES OR NO:22294} {yes/no/with/without intent/plan:22693}   {CHL IP B[facility] SUICIDE RISK ATTEMPT AS MANIFESTED ZO:[id number]}  Protective Factors: {Protective Factors for Suicide WUJW:11914782}  Plan: Patient will return in 2-4 weeks.   Diagnosis: Axis I: PTSD (post-traumatic stress disorder)  Adjustment disorder with mixed anxiety and depressed mood    Collaboration of Care: Other provider involved in patient's care [facility]  Patient/Guardian was advised Release of Information must be obtained prior to any record release in order to collaborate their care with an outside provider. Patient/Guardian was advised if they have not already done so to contact the registration department to sign all necessary forms in order for us  to release information regarding their care.   Consent: Patient/Guardian gives verbal consent for treatment and assignment of benefits for services provided during this visit. Patient/Guardian expressed understanding and agreed to proceed.

## 2024-01-16 ENCOUNTER — Ambulatory Visit (HOSPITAL_COMMUNITY): Admitting: Licensed Clinical Social Worker

## 2024-01-29 ENCOUNTER — Ambulatory Visit (INDEPENDENT_AMBULATORY_CARE_PROVIDER_SITE_OTHER): Admitting: Licensed Clinical Social Worker

## 2024-01-29 DIAGNOSIS — F4323 Adjustment disorder with mixed anxiety and depressed mood: Secondary | ICD-10-CM

## 2024-01-29 DIAGNOSIS — F4310 Post-traumatic stress disorder, unspecified: Secondary | ICD-10-CM

## 2024-01-29 NOTE — Progress Notes (Unsigned)
 THERAPIST PROGRESS NOTE  Session Time: 2:00 pm-2:45 pm  Type of Therapy: Individual Therapy  Purpose of session/Treatment Goals addressed: Edward Anderson will manage mood and anxiety as evidenced by reducing negative self talk, managing anxious thoughts, and improving sleep.   Interventions: Therapist utilized CBT, ACT,  and Solution focused brief therapy to address mood, and anxiety. Therapist provided support and empathy to patient during session. Therapist administered the PHQ9 and GAD7. Therapist worked with patient on reducing negative self talk by identifying evidence of things he has done right.    Effectiveness: Patient was oriented x4 (person, place, situation, and time) . Patient was  Appropriate. Patient was Casually dressed. Patient completed the PHQ9 and GAD7. Patient noted that things have been ok. He has been working and staying busy. He continues to see the girl that he has been talking to. Patient told her that he was married and going through a divorce. He wasn't trying to hide it but was trying to find the right time. Patient felt bad and told her. She was upset but was handling it. They are working through it. Patient felt like he was a bad person for not telling her. He felt like he was not a good person for talking to her and not being divorced. Patient feels like he can't do anything right. Patient only sees his faults. Patient is going to focus on identifying what he is doing right.   Patient engaged in session. Patient responded well to interventions. Patient continues to meet criteria for PTSD (post-traumatic stress disorder)  Adjustment disorder with mixed anxiety and depressed mood  Patient will continue in outpatient therapy due to being the least restrictive service to meet his needs. Patient made minimal progress on his goals at this time.     Suicidal/Homicidal:  No without intent/plan   Other (specify): TBI  Protective Factors: hope for the future  Plan: Patient will  return in 2-4 weeks.   Diagnosis: Axis I: PTSD (post-traumatic stress disorder)  Adjustment disorder with mixed anxiety and depressed mood    Collaboration of Care: Other provider involved in [facility]'s care [facility]  Patient/Guardian was advised Release of Information must be obtained prior to any record release in order to collaborate their care with an outside provider. Patient/Guardian was advised if they have not already done so to contact the registration department to sign all necessary forms in order for us  to release information regarding their care.   Consent: Patient/Guardian gives verbal consent for treatment and assignment of benefits for services provided during this visit. Patient/Guardian expressed understanding and agreed to proceed.

## 2024-02-06 ENCOUNTER — Ambulatory Visit (HOSPITAL_COMMUNITY): Admitting: Licensed Clinical Social Worker

## 2024-02-12 ENCOUNTER — Ambulatory Visit (INDEPENDENT_AMBULATORY_CARE_PROVIDER_SITE_OTHER): Admitting: Licensed Clinical Social Worker

## 2024-02-12 DIAGNOSIS — F4310 Post-traumatic stress disorder, unspecified: Secondary | ICD-10-CM | POA: Diagnosis not present

## 2024-02-12 NOTE — Progress Notes (Signed)
 THERAPIST PROGRESS NOTE  Session Time: 11:00 am-11:30 am  Type of Therapy: Individual Therapy  Purpose of session/Treatment Goals addressed: Edward Anderson will manage mood and anxiety as evidenced by reducing negative self talk, managing anxious thoughts, and improving sleep.   Interventions: Therapist utilized CBT, ACT,  and Solution focused brief therapy to address mood, and anxiety. Therapist provided support and empathy to patient during session. Therapist administered the PHQ9 and GAD7. Therapist worked with patient on managing anxious thoughts related to health care.    Effectiveness: Patient was oriented x4 (person, place, situation, and time) . Patient was  Appropriate. Patient was Casually dressed. Patient completed the PHQ9 and GAD7. Patient noted that he had to shorten his session today to take care of a family rental property issue. Patient had a follow up at the [facility] for shoulder pain. Patient is going to get an MRI. He had a surgery on it last Nov. Patient has been telling doctors about his pain but feels like they manipulate him and it takes him a long time to get help. Patient wants to advocate for himself but also feels like the [facility] will retal[facility] him. Patient had his father with him during his [facility] visit and his father even noted that it was a strange interaction with his doctor. Patient has asked for medication related to erectile dysfunction but was refused.  Patient feels like he asks for help and doesn't get the help he needs. Patient is officially in a relationship. He still has some uncomfortable feelings about it because he is not divorced yet. Patient has deep feelings for her but doesn't feel like he loves her.   Patient engaged in session. Patient responded well to interventions. Patient continues to meet criteria for PTSD (post-traumatic stress disorder)  Patient will continue in outpatient therapy due to being the least restrictive service to meet his needs. Patient made minimal  progress on his goals at this time.     Suicidal/Homicidal:  No without intent/plan   Other (specify): TBI  Protective Factors: hope for the future  Plan: Patient will return in 2-4 weeks.   Diagnosis: Axis I: PTSD (post-traumatic stress disorder)    Collaboration of Care: Other provider involved in [facility]'s care [facility]  Patient/Guardian was advised Release of Information must be obtained prior to any record release in order to collaborate their care with an outside provider. Patient/Guardian was advised if they have not already done so to contact the registration department to sign all necessary forms in order for us  to release information regarding their care.   Consent: Patient/Guardian gives verbal consent for treatment and assignment of benefits for services provided during this visit. Patient/Guardian expressed understanding and agreed to proceed.

## 2024-03-04 ENCOUNTER — Ambulatory Visit (INDEPENDENT_AMBULATORY_CARE_PROVIDER_SITE_OTHER): Admitting: Licensed Clinical Social Worker

## 2024-03-04 DIAGNOSIS — F4310 Post-traumatic stress disorder, unspecified: Secondary | ICD-10-CM

## 2024-03-04 NOTE — Progress Notes (Signed)
 THERAPIST PROGRESS NOTE  Session Time: 3:02 pm-3:45 pm  Type of Therapy: Individual Therapy  Purpose of session/Treatment Goals addressed: Omarii will manage mood and anxiety as evidenced by reducing negative self talk, managing anxious thoughts, and improving sleep.   Interventions: Therapist utilized CBT, ACT,  and Solution focused brief therapy to address mood, and anxiety. Therapist provided support and empathy to patient during session. Therapist worked with patient on reducing negative self talk.      Effectiveness: Patient was oriented x4 (person, place, situation, and time) . Patient was  Appropriate. Patient was Casually dressed. Patient noted that things have been ok. Patient had a situation where he was frustrated that his hair cut was taking too long. It was impacting his ability to accomplish other things such as get gas before his therapy appointment. Patient didn't express his frustration. 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 Patient also noted that when things don't work out or he takes too long completing something he will be negative toward himself. Patient had difficulty verbalizing why. Patient keeps routines to give his life purpose otherwise he would feel worse. Patient also understood that falling short of those routines impacts his mood. Patient is trying to work on giving himself grace and not express his emotions in a harsh way.   Patient engagedin session. Patient responded well to interventions. Patient continues to meet criteria for PTSD (post-traumatic stress disorder)  Patient will continue in outpatient therapy due to being the least restrictive service to meet his needs. Patient made minimal progress on his goals at this time.      Suicidal/Homicidal:  No without intent/plan   Other (specify): TBI  Protective Factors: hope for the future  Plan: Patient will return in 2-4 weeks.   Diagnosis: Axis I: PTSD (post-traumatic stress disorder)    Collaboration of Care: Other provider involved in [facility]'s care [facility]  Patient/Guardian was advised Release of Information must be obtained prior to any record release in order to collaborate their care with an outside provider. Patient/Guardian was advised if they have not already done so to contact the registration department to sign all necessary forms in order for us  to release information regarding their care.   Consent: Patient/Guardian gives verbal consent for treatment and assignment of benefits for services provided during this visit. Patient/Guardian expressed understanding and agreed to proceed.

## 2024-04-09 ENCOUNTER — Ambulatory Visit (INDEPENDENT_AMBULATORY_CARE_PROVIDER_SITE_OTHER): Admitting: Licensed Clinical Social Worker

## 2024-04-09 DIAGNOSIS — F4310 Post-traumatic stress disorder, unspecified: Secondary | ICD-10-CM

## 2024-04-09 DIAGNOSIS — F4323 Adjustment disorder with mixed anxiety and depressed mood: Secondary | ICD-10-CM | POA: Diagnosis not present

## 2024-04-09 NOTE — Progress Notes (Unsigned)
 THERAPIST PROGRESS NOTE  Session Time: 3:02 pm-3:45 pm  Type of Therapy: Individual Therapy  Purpose of session/Treatment Goals addressed: Edward Anderson will manage mood and anxiety as evidenced by reducing negative self talk, managing anxious thoughts, and improving sleep.   Behavior: Patient was oriented x4 (person, place, situation, and time) . Patient was  Appropriate. Patient was Casually dressed. Patient made minimal progress on his goals at this time.   Interventions:   Effectiveness:   Patient engagedin session. Patient responded well to interventions. Patient continues to meet criteria for PTSD (post-traumatic stress disorder)  Adjustment disorder with mixed anxiety and depressed mood  Patient will continue in outpatient therapy due to being the least restrictive service to meet his needs.     Plan: Patient will return in 2-4 weeks.   Suicidal/Homicidal:  No without intent/plan   Other (specify): TBI  Protective Factors: hope for the future  Collaboration of Care: Other provider involved in [facility]'s care [facility]  Patient/Guardian was advised Release of Information must be obtained prior to any record release in order to collaborate their care with an outside provider. Patient/Guardian was advised if they have not already done so to contact the registration department to sign all necessary forms in order for us  to release information regarding their care.   Consent: Patient/Guardian gives verbal consent for treatment and assignment of benefits for services provided during this visit. Patient/Guardian expressed understanding and agreed to proceed.

## 2024-05-07 ENCOUNTER — Ambulatory Visit (INDEPENDENT_AMBULATORY_CARE_PROVIDER_SITE_OTHER): Admitting: Licensed Clinical Social Worker

## 2024-05-07 DIAGNOSIS — F4310 Post-traumatic stress disorder, unspecified: Secondary | ICD-10-CM

## 2024-05-07 NOTE — Progress Notes (Unsigned)
 THERAPIST PROGRESS NOTE  Session Time: 2:00 pm-2:45 pm  Type of Therapy: Individual Therapy  Treatment Goals addressed: Dawon will manage mood and anxiety as evidenced by reducing negative self talk, managing anxious thoughts, and improving sleep.   Session Goal: negative self talk  Behavior:  Patient noted that life is not so good. He has a tear in his shoulder that he had surgery on for the same reason. He has talked to his doctor for almost a year telling them something was wrong. He feels like the doctors finally agreed and now he may need surgery. 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 He doesn't want surgery. He wants to sign a DNR.  Patient was oriented x4 (person, place, situation, and time) . Patient was  Appropriate. Patient was Casually dressed. Patient made minimal progress on his goals at this time.   Interventions: Therapist used CBT interventions of cognitive restructuring to challenge automatic negative thoughts.    Response: Patient verbalized automatic negative thoughts such as being a failure and wanting to pack up and leave. Patient also has been having vivid dreams and some have been about memories from childhood or the [employer].  Patient has been able to identify things he has done well. He has also identified that there is no reason for him to pack up and leave. He has not been hurt by anyone but still has the feeling. He understood that the grass is not greener on the other side.   Patient engagedin session. Patient responded well to interventions. Patient continues to meet criteria for PTSD (post-traumatic stress disorder)  Patient will continue in outpatient therapy due to being the least restrictive service to meet his needs.     Plan: Patient will return in 2-4 weeks.  Patient will work on challenging negative thoughts when failure or wanting to run away comes up.   Suicidal/Homicidal:  No without intent/plan   Other (specify):  TBI  Protective Factors: hope for the future  Collaboration of Care: Other provider involved in [facility] care [facility]  Patient/Guardian was advised Release of Information must be obtained prior to any record release in order to collaborate their care with an outside provider. Patient/Guardian was advised if they have not already done so to contact the registration department to sign all necessary forms in order for us  to release information regarding their care.   Consent: Patient/Guardian gives verbal consent for treatment and assignment of benefits for services provided during this visit. Patient/Guardian expressed understanding and agreed to proceed.

## 2024-06-02 ENCOUNTER — Ambulatory Visit (HOSPITAL_COMMUNITY): Admitting: Licensed Clinical Social Worker

## 2024-06-02 DIAGNOSIS — F4310 Post-traumatic stress disorder, unspecified: Secondary | ICD-10-CM

## 2024-06-02 DIAGNOSIS — F4323 Adjustment disorder with mixed anxiety and depressed mood: Secondary | ICD-10-CM

## 2024-06-02 NOTE — Progress Notes (Signed)
 THERAPIST PROGRESS NOTE  Session Time: 8:55 am-9:40 am  Type of Therapy: Individual Therapy  Treatment Goals addressed: Edward Anderson will manage mood and anxiety as evidenced by reducing negative self talk, managing anxious thoughts, and improving sleep.   Session Goal: negative self talk  Behavior:  Patient is feeling down and irritable. He is struggling in school and upset with his family. Patient was oriented x4 (person, place, situation, and time) . Patient was  Appropriate. Patient was Casually dressed. Patient made minimal progress on his goals at this time.   Interventions: Therapist used CBT interventions of cognitive distortions and identifying values.   Response: Patient's teacher is counting an answer completely wrong if he misspells a word in Biology. He understands the concepts but can't always remember the correct spelling. He feels like if he doesn't move his grade from a C to a B then it will count against him related to getting into his program. 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 He feels like because he isn't available then they get someone else to fix it which they then charge his family a large amount. Patient feels like he will be responsible for the loss of the rental property if he doesn't help but if he helps then he will not be able to focus on school. His values are running into each other.   Patient engagedin session. Patient responded well to interventions. Patient continues to meet criteria for PTSD (post-traumatic stress disorder)  Adjustment disorder with mixed anxiety and depressed mood  Patient will continue in outpatient therapy due to being the least restrictive service to meet his needs.     Plan: Patient will return in 2-4 weeks.  Patient will pay attention to values and take values based action. Patient will challenge personalization thoughts.    Suicidal/Homicidal:  No without intent/plan   Other (specify): TBI  Protective Factors: hope for the future  Collaboration of Care: Other provider involved in [facility] care [facility]  Patient/Guardian was advised Release of Information must be obtained prior to any record release in order to collaborate their care with an outside provider. Patient/Guardian was advised if they have not already done so to contact the registration department to sign all necessary forms in order for us  to release information regarding their care.   Consent: Patient/Guardian gives verbal consent for treatment and assignment of benefits for services provided during this visit. Patient/Guardian expressed understanding and agreed to proceed.

## 2024-06-25 ENCOUNTER — Ambulatory Visit (INDEPENDENT_AMBULATORY_CARE_PROVIDER_SITE_OTHER): Admitting: Licensed Clinical Social Worker

## 2024-06-25 DIAGNOSIS — F4323 Adjustment disorder with mixed anxiety and depressed mood: Secondary | ICD-10-CM

## 2024-06-25 DIAGNOSIS — F4310 Post-traumatic stress disorder, unspecified: Secondary | ICD-10-CM

## 2024-06-25 NOTE — Progress Notes (Signed)
 Virtual Visit via Video Note  I connected with Edward Anderson on 06/25/24 at  9:00 AM EST by a video enabled telemedicine application and verified that I am speaking with the correct person using two identifiers.  Location: Patient: Home Provider: Office   I discussed the limitations of evaluation and management by telemedicine and the availability of in person appointments. The patient expressed understanding and agreed to proceed.  I discussed the assessment and treatment plan with the patient. The patient was provided an opportunity to ask questions and all were answered. The patient agreed with the plan and demonstrated an understanding of the instructions.   The patient was advised to call back or seek an in-person evaluation if the symptoms worsen or if the condition fails to improve as anticipated.  I provided 46 minutes of non-face-to-face time during this encounter.   Fonda Conroy, LCSW   THERAPIST PROGRESS NOTE  Session Time: 9:00 am-9:46 am  Type of Therapy: Individual Therapy  Treatment Goals addressed: Edward Anderson will manage mood and anxiety as evidenced by reducing negative self talk, managing anxious thoughts, and improving sleep.   Session Goal: expectations  Behavior:  Patient recently got the results of a DNA test through [email or web address] and found out that he has a half brother. He has connected with him and spoke to him by phone. They are going to met up over the weekend. He also went to his girlfriends family in [place] . They drove and her heated seats are what got him through the drive with his back pain. Patient was oriented x4 (person, place, situation, and time) . Patient was  Appropriate. Patient was Casually dressed. Patient made minimal progress on his goals at this time.   Interventions: Therapist used CBT interventions of cognitive restructuring related to expectations.   Response: Patient has been changing his expectations for himself. He is trying to be  reasonable and realistic. He spoke to his school about getting accommodations such as separate testing. He was hesitant to do this because of the word disability. He prefers saying conditions. Patient has also reduced the amount of time he is studying which reduces his stress and his grades have improved since then.    Patient engagedin session. Patient responded well to interventions. Patient continues to meet criteria for PTSD (post-traumatic stress disorder)  Adjustment disorder with mixed anxiety and depressed mood  Patient will continue in outpatient therapy due to being the least restrictive service to meet his needs.     Plan: Patient will return in 2-4 weeks.  Patient will write down what he wants to discuss in treatment. Patient will continue with identifying realistic expectations for himself with his conditions.   Suicidal/Homicidal:  No without intent/plan   Other (specify): TBI  Protective Factors: hope for the future  Collaboration of Care: Other provider involved in [facility] care [facility]  Patient/Guardian was advised Release of Information must be obtained prior to any record release in order to collaborate their care with an outside provider. Patient/Guardian was advised if they have not already done so to contact the registration department to sign all necessary forms in order for us  to release information regarding their care.   Consent: Patient/Guardian gives verbal consent for treatment and assignment of benefits for services provided during this visit. Patient/Guardian expressed understanding and agreed to proceed.

## 2024-08-07 ENCOUNTER — Ambulatory Visit (HOSPITAL_COMMUNITY): Admitting: Licensed Clinical Social Worker

## 2024-08-13 ENCOUNTER — Encounter (HOSPITAL_COMMUNITY): Payer: Self-pay | Admitting: Licensed Clinical Social Worker

## 2024-08-13 NOTE — Progress Notes (Signed)
 Behavioral Health Transition Summary  1. Administrative Overview  Client Name/ID: Ralf Konopka   Date of Transition: 01.21.2026  Reason for Transfer: clinician departure  Total Duration of Treatment: April 2025-Dec2025  Frequency of Sessions: Monthly  2. Clinical Picture  Primary Diagnosis (ICD-10/DSM-5): PTSD  Secondary/Rule-out Diagnoses: Adjustment disorder with mixed anxiety and depression  Current Medications: See medical chart  Risk Assessment:  No previous hospitalizations for mental health noted  Suicidality/Homicidality: Patient has admitted to transient passive SI with no plan or means. No homicidal ideations.   Self-Harm: None  Safety Plan in Place? No  3. Treatment Progress & Modalities  Primary Modalities Used: CBT and SFBT  Core Issues Addressed:  1) Accepting life with a TBI/Coping with frustrations 2) Not feeling like he has failed or is a failure/ negative self talk/worthlessness 3) Relationships: going through divorce and is in a current relationship  Recent Progress: He has advocated for himself with a teacher when he felt like a class exercise was embarrassing because it centered around memory. Daundre has asked for accomidations at school for his course work with was difficulty for him to do because it doesn't want to feel or be viewed as different   Current Barriers/Stagnation: Patient has a TBI and doesn't like to share with others due to fear of judgement. He also speaks negatively about himself when he fails or doesn't do something perfectly the first time or if he can't remember something.   4. Relational Dynamics  Therapeutic Alliance: Patient can take time to build trust. He was transferred from another therapist whose clinic stopped taking his insurance so he was referred to [facility]. Patient is open to feedback/being direct.   Transference/Countertransference Notes: Patient can assume an expected outcome/expect therapist to act/respond in a  negative way because of how other professions in his care team have reacted or treated him  5. Recommendations for Next Therapist  Immediate Goals for Next 3 Sessions: Build rapport and clarify expectations for therapy, patient expectations of therapy, therapist expectations for patient, and realistic outcomes. Update goals. Develop a system for patient to have written reminders of homework between session.    Preferred Communication Style: Conversational. Patient will forget therapist name or what was discussed at times.

## 2024-08-14 ENCOUNTER — Ambulatory Visit (HOSPITAL_COMMUNITY): Admitting: Licensed Clinical Social Worker

## 2024-08-14 DIAGNOSIS — F4310 Post-traumatic stress disorder, unspecified: Secondary | ICD-10-CM | POA: Diagnosis not present

## 2024-08-14 NOTE — Progress Notes (Signed)
 "  THERAPIST PROGRESS NOTE  Virtual Visit via Video Note  I connected with Edward Anderson on 08/14/24 at  3:00 PM EST by a video enabled telemedicine application and verified that I am speaking with the correct person using two identifiers.  Location: Patient: Pt home  Provider: Providers Home    I discussed the limitations of evaluation and management by telemedicine and the availability of in person appointments. The patient expressed understanding and agreed to proceed.     I discussed the assessment and treatment plan with the patient. The patient was provided an opportunity to ask questions and all were answered. The patient agreed with the plan and demonstrated an understanding of the instructions.   The patient was advised to call back or seek an in-person evaluation if the symptoms worsen or if the condition fails to improve as anticipated.  I provided 45 minutes of non-face-to-face time during this encounter.   Edward GORMAN Patee, LCSW   Participation Level: Active  Behavioral Response: CasualAlertAnxious and Depressed  Type of Therapy: Individual Therapy  Treatment Goals addressed:   ProgressTowards Goals: Progressing  Interventions: CBT, Motivational Interviewing, and Supportive  Summary: Edward Anderson is a 39 y.o. male who presents with   Suicidal/Homicidal: Nowithout intent/plan  Therapist Response:      Subjective/Objective: Edward Anderson was alert and oriented 5. He was pleasant, cooperative, and maintained appropriate eye contact throughout the session. He was engaged in the therapy session and dressed casually. Patient presented with anxious and depressed mood with congruent affect.  Patient transferred care to this LCSW from Edward Mace, LCSW. He reports prior treatment focus on self-esteem, trauma related to [employer] service, post-operative recovery from rotator cuff surgery, adjustment to civilian life, and stressors related to an ongoing divorce. Patient  reports current treatment goals include development and strengthening of coping skills to reduce symptoms associated with PTSD, anxiety, and depression.  Patient endorses PTSD symptoms consistent with DSM-5-TR criteria, including avoidance of trauma-related stimuli, re-experiencing symptoms (intrusive memories), negative alterations in mood and cognition, and hyperarousal symptoms such as irritability/anger and hypervigilance. Patient reports functional impairment related to these symptoms. Patient identifies long-term goals of decreasing frequency and intensity of PTSD symptoms and improving emotional regulation and overall functioning.  Patient reports increased self-consciousness related to frequent provider turnover within the [facility] system and transition to a new therapist. He acknowledges intermittent cognitive distortions, including personalization of provider changes, while demonstrating insight into the irrational nature of these thoughts.  Interventions/Plan: LCSW utilized psychodynamic interventions to provide a supportive, nonjudgmental environment for exploration of trauma-related themes, emotional responses, and interpersonal patterns. Supportive therapy interventions were utilized to provide validation, normalization of symptoms, and encouragement. Strength-based interventions were used to review and reinforce short- and long-term treatment goals. CBT interventions were utilized to address maladaptive thought patterns through cognitive restructuring and reframing.  Plan is to continue trauma-informed, evidence-based treatment focused on PTSD symptom reduction, cognitive restructuring, and coping skill development.  Plan: Return again in  weeks.  Diagnosis: PTSD (post-traumatic stress disorder)  Collaboration of Care: Other start therapy monthly next session to be in person   Patient/Guardian was advised Release of Information must be obtained prior to any record release in order to  collaborate their care with an outside provider. Patient/Guardian was advised if they have not already done so to contact the registration department to sign all necessary forms in order for us  to release information regarding their care.   Consent: Patient/Guardian gives verbal consent for treatment and assignment of benefits  for services provided during this visit. Patient/Guardian expressed understanding and agreed to proceed.   Edward GORMAN Patee, LCSW 08/14/2024  "

## 2024-08-28 ENCOUNTER — Ambulatory Visit (HOSPITAL_COMMUNITY): Admitting: Licensed Clinical Social Worker

## 2024-09-09 ENCOUNTER — Ambulatory Visit (HOSPITAL_COMMUNITY): Admitting: Licensed Clinical Social Worker
